# Patient Record
Sex: Female | Born: 1945 | Race: Black or African American | Hispanic: No | Marital: Married | Smoking: Never smoker
Health system: Southern US, Community
[De-identification: ages and names within clinical notes are randomized; demographics above are authoritative.]

## PROBLEM LIST (undated history)

## (undated) DIAGNOSIS — D649 Anemia, unspecified: Secondary | ICD-10-CM

## (undated) DIAGNOSIS — E7800 Pure hypercholesterolemia, unspecified: Secondary | ICD-10-CM

## (undated) DIAGNOSIS — Z860100 Personal history of colon polyps, unspecified: Secondary | ICD-10-CM

## (undated) DIAGNOSIS — G039 Meningitis, unspecified: Secondary | ICD-10-CM

## (undated) DIAGNOSIS — G459 Transient cerebral ischemic attack, unspecified: Secondary | ICD-10-CM

## (undated) DIAGNOSIS — I4891 Unspecified atrial fibrillation: Secondary | ICD-10-CM

## (undated) DIAGNOSIS — I6381 Other cerebral infarction due to occlusion or stenosis of small artery: Secondary | ICD-10-CM

## (undated) DIAGNOSIS — I499 Cardiac arrhythmia, unspecified: Secondary | ICD-10-CM

## (undated) DIAGNOSIS — I10 Essential (primary) hypertension: Secondary | ICD-10-CM

## (undated) DIAGNOSIS — R7303 Prediabetes: Secondary | ICD-10-CM

## (undated) DIAGNOSIS — I639 Cerebral infarction, unspecified: Secondary | ICD-10-CM

## (undated) DIAGNOSIS — K5790 Diverticulosis of intestine, part unspecified, without perforation or abscess without bleeding: Secondary | ICD-10-CM

## (undated) HISTORY — PX: ABDOMINAL HYSTERECTOMY: SHX81

## (undated) HISTORY — PX: COLONOSCOPY: SHX174

---

## 2004-11-22 ENCOUNTER — Ambulatory Visit: Payer: Self-pay | Admitting: Unknown Physician Specialty

## 2005-12-06 ENCOUNTER — Ambulatory Visit: Payer: Self-pay | Admitting: Unknown Physician Specialty

## 2007-03-26 ENCOUNTER — Inpatient Hospital Stay: Payer: Self-pay | Admitting: Internal Medicine

## 2007-03-26 ENCOUNTER — Other Ambulatory Visit: Payer: Self-pay

## 2007-04-03 ENCOUNTER — Encounter: Payer: Self-pay | Admitting: Internal Medicine

## 2007-04-07 ENCOUNTER — Encounter: Payer: Self-pay | Admitting: Internal Medicine

## 2007-05-05 ENCOUNTER — Encounter: Payer: Self-pay | Admitting: Internal Medicine

## 2007-08-18 ENCOUNTER — Emergency Department: Payer: Self-pay | Admitting: Emergency Medicine

## 2008-03-25 ENCOUNTER — Ambulatory Visit: Payer: Self-pay | Admitting: Unknown Physician Specialty

## 2008-09-26 ENCOUNTER — Emergency Department: Payer: Self-pay | Admitting: Emergency Medicine

## 2009-04-07 ENCOUNTER — Ambulatory Visit: Payer: Self-pay | Admitting: Unknown Physician Specialty

## 2010-04-21 ENCOUNTER — Ambulatory Visit: Payer: Self-pay | Admitting: Unknown Physician Specialty

## 2011-06-23 ENCOUNTER — Ambulatory Visit: Payer: Self-pay | Admitting: Unknown Physician Specialty

## 2012-06-24 ENCOUNTER — Ambulatory Visit: Payer: Self-pay | Admitting: Unknown Physician Specialty

## 2013-02-23 ENCOUNTER — Inpatient Hospital Stay: Payer: Self-pay | Admitting: Internal Medicine

## 2013-02-23 LAB — COMPREHENSIVE METABOLIC PANEL
Albumin: 3.6 g/dL (ref 3.4–5.0)
Alkaline Phosphatase: 87 U/L
Bilirubin,Total: 0.4 mg/dL (ref 0.2–1.0)
Calcium, Total: 8.9 mg/dL (ref 8.5–10.1)
Chloride: 106 mmol/L (ref 98–107)
Co2: 24 mmol/L (ref 21–32)
Creatinine: 0.93 mg/dL (ref 0.60–1.30)
EGFR (African American): >60
EGFR (Non-African Amer.): >60
Osmolality: 275 (ref 275–301)
Potassium: 3.4 mmol/L — ABNORMAL LOW (ref 3.5–5.1)
SGOT(AST): 36 U/L (ref 15–37)

## 2013-02-23 LAB — CBC WITH DIFFERENTIAL/PLATELET
Basophil #: 0 10*3/uL (ref 0.0–0.1)
Basophil %: 0.3 %
HCT: 35.6 % (ref 35.0–47.0)
HGB: 11.5 g/dL — ABNORMAL LOW (ref 12.0–16.0)
Lymphocyte #: 0.3 10*3/uL — ABNORMAL LOW (ref 1.0–3.6)
Lymphocyte %: 5.2 %
MCH: 27.8 pg (ref 26.0–34.0)
Monocyte #: 0.3 x10 3/mm (ref 0.2–0.9)
RBC: 4.13 10*6/uL (ref 3.80–5.20)
WBC: 5.4 10*3/uL (ref 3.6–11.0)

## 2013-02-23 LAB — URINALYSIS, COMPLETE
Bacteria: NONE SEEN
Bilirubin,UR: NEGATIVE
Blood: NEGATIVE
Ketone: NEGATIVE
Leukocyte Esterase: NEGATIVE
Ph: 6 (ref 4.5–8.0)
RBC,UR: 1 /HPF (ref 0–5)
WBC UR: 2 /HPF (ref 0–5)

## 2013-02-25 LAB — BASIC METABOLIC PANEL
Calcium, Total: 9.4 mg/dL (ref 8.5–10.1)
Chloride: 108 mmol/L — ABNORMAL HIGH (ref 98–107)
Co2: 22 mmol/L (ref 21–32)
Creatinine: 1.01 mg/dL (ref 0.60–1.30)
Glucose: 93 mg/dL (ref 65–99)
Osmolality: 278 (ref 275–301)
Potassium: 4.2 mmol/L (ref 3.5–5.1)
Sodium: 139 mmol/L (ref 136–145)

## 2013-02-25 LAB — CBC WITH DIFFERENTIAL/PLATELET
Basophil #: 0 10*3/uL (ref 0.0–0.1)
Basophil %: 0.3 %
Eosinophil %: 0 %
HCT: 31.4 % — ABNORMAL LOW (ref 35.0–47.0)
Lymphocyte %: 3.9 %
MCHC: 32.5 g/dL (ref 32.0–36.0)
MCV: 86 fL (ref 80–100)
Monocyte %: 2.1 %
RBC: 3.66 10*6/uL — ABNORMAL LOW (ref 3.80–5.20)
RDW: 15 % — ABNORMAL HIGH (ref 11.5–14.5)
WBC: 14.6 10*3/uL — ABNORMAL HIGH (ref 3.6–11.0)

## 2013-02-26 LAB — CBC WITH DIFFERENTIAL/PLATELET
Eosinophil #: 0 10*3/uL (ref 0.0–0.7)
HCT: 32.4 % — ABNORMAL LOW (ref 35.0–47.0)
Lymphocyte #: 0.5 10*3/uL — ABNORMAL LOW (ref 1.0–3.6)
MCH: 28 pg (ref 26.0–34.0)
MCHC: 32.5 g/dL (ref 32.0–36.0)
MCV: 86 fL (ref 80–100)
Monocyte #: 0.8 x10 3/mm (ref 0.2–0.9)
Neutrophil #: 18.4 10*3/uL — ABNORMAL HIGH (ref 1.4–6.5)
Platelet: 153 10*3/uL (ref 150–440)
RBC: 3.76 10*6/uL — ABNORMAL LOW (ref 3.80–5.20)
WBC: 19.7 10*3/uL — ABNORMAL HIGH (ref 3.6–11.0)

## 2013-02-26 LAB — BASIC METABOLIC PANEL
Calcium, Total: 9.2 mg/dL (ref 8.5–10.1)
Chloride: 109 mmol/L — ABNORMAL HIGH (ref 98–107)
Co2: 25 mmol/L (ref 21–32)
Creatinine: 0.95 mg/dL (ref 0.60–1.30)
EGFR (African American): >60
EGFR (Non-African Amer.): >60
Glucose: 104 mg/dL — ABNORMAL HIGH (ref 65–99)
Potassium: 3.7 mmol/L (ref 3.5–5.1)

## 2013-02-28 LAB — CULTURE, BLOOD (SINGLE)

## 2013-10-22 ENCOUNTER — Ambulatory Visit: Payer: Self-pay | Admitting: Physician Assistant

## 2013-12-25 ENCOUNTER — Ambulatory Visit: Payer: Self-pay | Admitting: Gastroenterology

## 2014-06-26 NOTE — H&P (Signed)
PATIENT NAME:  Dorothy Potter, Dorothy Potter MR#:  [id number] DATE OF BIRTH:  05-Jun-1945  DATE OF ADMISSION:  02/23/2013  PRIMARY CARE PROVIDER:  Lora PaulaMimi McLaughlin, PA-C   CHIEF COMPLAINT: Shortness of breath, cough, weakness.   HISTORY OF PRESENT ILLNESS: A 69 year old African-American female patient with a history of hypertension, presents to the Emergency Room complaining of weakness and cough. The patient has also had some shortness of breath earlier today, but no chest pain, orthopnea or edema. No dysuria. Here in the Emergency Room, the patient's chest x-ray has shown right upper lobe pneumonia along with saturations of 87% on room air, needing 2 liters oxygen, and is being admitted to the hospitalist service for further care.   The patient mentions that her blood pressure has been low, in the range of systolic of 100s, while normally it runs at 120s. The patient is on 4 different blood pressure medications, and has not taken those medications today.   PAST MEDICAL HISTORY: 1.  Hypertension.  2.  Chronic anemia.  3.  Hyperlipidemia.   PAST SURGICAL HISTORY: Partial hysterectomy.   SOCIAL HISTORY: The patient does not smoke. No alcohol. Lives at home with her husband.   CODE STATUS: FULL CODE.  ALLERGIES: No known drug allergies.   FAMILY HISTORY: Hypertension.   REVIEW OF SYSTEMS: CONSTITUTIONAL: Complains of weakness. No weight loss, weight gain.  EYES: No blurred vision, pain, tenderness.   ENT:  No tinnitus, ear pain, hearing loss.  RESPIRATORY: Has cough, some shortness of breath.  CARDIOVASCULAR: No chest pain, orthopnea, edema.  GASTROINTESTINAL: No nausea, vomiting, diarrhea, abdominal pain.  GENITOURINARY: No dysuria, hematuria, frequency.  ENDOCRINE: No polyuria, nocturia, thyroid problems. HEMATOLOGIC/LYMPHATIC:  No anemia, easy bruising, bleeding.  INTEGUMENTARY: No acne, rash, lesions. MUSCULOSKELETAL: No joint swelling, redness, effusion.  NEUROLOGIC: No focal numbness,  weakness, seizures. Does complain of generalized weakness.  PSYCHIATRIC:  No anxiety or depression.   HOME MEDICATIONS INCLUDE:   1.  Atenolol 50 mg oral daily.  2.  Aspirin 81 mg daily.  3.  Diovan 320 mg daily.  4.  Ferrous sulfate orally once a day.  5.  Lasix 20 mg daily.  6.  Lescol capsule 40 mg once a day.  7.  Norvasc 10 mg a day.  8.  Terazosin 5 mg daily.   PHYSICAL EXAMINATION: VITAL SIGNS: Temperature 99.7, with pulse of 86, blood pressure 124/56, saturating 87% on room air, 91% on 2 liters oxygen.  GENERAL: An obese, African-American female patient, lying in bed. Seems comfortable, in no acute distress.  PSYCHIATRIC:  She is alert, oriented x 3. Mood and affect appropriate. Judgment intact.  HEENT: Atraumatic, normocephalic. Oral mucosa dry and pink. External ears and nose normal. No pallor. No icterus. Pupils bilaterally equal and react to light.   NECK: Supple. No thyromegaly or palpable lymph nodes. Trachea midline. No carotid bruit, JVD.  CARDIOVASCULAR: S1, S2, without any murmurs. Peripheral pulses 2+.  RESPIRATORY: Normal work of breathing. Crackles in bilateral bases, left greater than right.  GASTROINTESTINAL: Soft abdomen, nontender. Bowel sounds present. No hepatosplenomegaly palpable.  GENITOURINARY: No CVA tendeness or bladder distention.  SKIN: Warm and dry. No petechiae, rash, ulcers.  MUSCULOSKELETAL: No joint swelling, redness, effusion of the large joints. Normal muscle tone.  NEUROLOGIC: Motor strength 5/5 in upper and lower extremities. Sensory is intact all over.  LYMPHATIC: No cervical lymphadenopathy.   LAB STUDIES: Show a glucose of 111, BUN 15, creatinine 0.93, sodium 137, potassium 3.4, chloride 106. GFR greater than  60. AST, ALT, alkaline phosphatase, bilirubin normal. hemoglobin 11.5, platelets 152, neutrophils 89%.   Chest x-ray, PA and lateral, shows right upper lobe pneumonia with mild left basilar opacity, likely atelectasis or pneumonia.    ASSESSMENT AND PLAN: 1. Bilateral community-acquired pneumonia with acute respiratory failure in a patient who is not  immunosuppressed or on steroids. Will admit the patient as inpatient, on IV Levaquin.  Oxygen to keep sats more than 90%, nebulizers p.r.n. The patient does have wheezing on examination. Will start her on oral steroids. Get sputum and blood cultures.   2.  Hypertension. The patient mentions that she had low blood pressure at home. Will hold off on her Norvasc and terazosin at this time. Continue the atenolol and Diovan.   3.  Hypokalemia. Replace orally.   4.  Hyperlipidemia. Continue her statin.  5.  Deep vein thrombosis prophylaxis with Lovenox.    6.  Code status:  FULL CODE.  Time spent on this case was 40 minutes.   ____________________________ Molinda Bailiff Dorene Bruni, MD srs:mr Potter: 02/23/2013 14:36:52 ET T: 02/23/2013 17:30:50 ET JOB#: [id number]  cc: Wardell Heath R. Liticia Gasior, MD, <Dictator> Maurine Minister, PA-C  Wardell Heath [place] MD ELECTRONICALLY SIGNED 02/24/2013 12:23

## 2014-06-27 NOTE — Discharge Summary (Signed)
PATIENT NAME:  Dorothy Potter, Havanna D MR#:  [id number] DATE OF BIRTH:  1945-11-23  DATE OF ADMISSION:  02/23/2013 DATE OF DISCHARGE:  02/26/2013  DISCHARGE DIAGNOSES: 1.  Acute respiratory failure secondary to pneumonia.  2.  Hypertension.  3.  Hyperlipidemia.  4.  Hypokalemia and chronic anemia.   CHIEF COMPLAINT: Shortness of breath, cough, weakness.   HISTORY OF PRESENT ILLNESS: Dorothy Potter is a 69 year old African American female, who presents to the ED complaining of cough associated with weakness and shortness of breath. She denies any chest pain or orthopnea. Chest x-ray showed evidence of right upper lobe pneumonia with O2 sat of 87% on room air, requiring supplemental oxygen. The patient also states that her blood pressure has been running low.   PAST MEDICAL HISTORY: Significant for hypertension, chronic anemia, hyperlipidemia.   PHYSICAL EXAMINATION: VITAL SIGNS: Temperature was 99.7. Pulse was 86. Blood pressure 124/56, O2 sat 87% on room air, improved to 91% on 2 liters nasal cannula oxygen.  GENERAL: She was not in distress.  HEENT: [place]/AT.  NECK: Supple.  HEART: S1, S2.  LUNGS: Crackles heard in both bases, especially in the right upper zone area.  ABDOMEN: Soft, nontender.  EXTREMITIES: No edema.   IMAGING AND LABORATORY DATA: Glucose 111, BUN 15, creatinine 0.93, sodium 137, potassium 3.4, chloride 106. Liver function tests were normal. Platelets 152. Chest x-ray showed evidence of upper lobe pneumonia with mild left basilar opacity, likely atelectasis.   The [facility] and started on intravenous Levaquin. She also underwent a CT of the chest, which showed evidence of dense right upper lobe consolidation with a pattern most typical for pneumonia. 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   During her stay in the hospital, the patient continued to make good progress. She was  weaned off her oxygen. She was also placed on prednisone taper, and her blood pressure medications were gradually reintroduced. She did have an elevated white cell count, part of that most likely was from the steroids; however, symptomatically, she was better, and she was discharged in stable condition on the following medications:  Levaquin 500 mg a day for 10 days, atenolol 50 mg b.i.d., ferrous sulfate 325 mg once a day, Norvasc 10 mg once a day, Accupril 81 mg a day, terazosin 5 mg once a day, Lescol 40 mg 1 capsule once a day and Diovan 320 mg once a day.  The patient  is advised to follow up with PA Maurine MinisterMiriam McLaughlin in 1 to 2 weeks' time. I advised also to have a chest x-ray at that time to ensure resolution of her pneumonia. The patient is stable at the time of discharge.   TOTAL TIME SPENT IN DISCHARGE OF THIS PATIENT: 35 minutes.   ____________________________ Barbette ReichmannVishwanath Cordie Beazley, MD vh:dmm D: 02/26/2013 12:23:58 ET T: 02/26/2013 12:47:49 ET JOB#: [id number]  cc: Barbette ReichmannVishwanath Melodie Ashworth, MD, <Dictator> Barbette ReichmannVISHWANATH Jacarie Pate MD ELECTRONICALLY SIGNED 03/07/2013 17:59

## 2014-06-29 LAB — SURGICAL PATHOLOGY

## 2014-10-07 ENCOUNTER — Other Ambulatory Visit: Payer: Self-pay | Admitting: Physician Assistant

## 2014-10-07 DIAGNOSIS — Z1231 Encounter for screening mammogram for malignant neoplasm of breast: Secondary | ICD-10-CM

## 2014-10-26 ENCOUNTER — Other Ambulatory Visit: Payer: Self-pay | Admitting: Physician Assistant

## 2014-10-26 ENCOUNTER — Ambulatory Visit
Admission: RE | Admit: 2014-10-26 | Discharge: 2014-10-26 | Disposition: A | Discharge status: Home / Self Care | Payer: Medicare Other | Source: Ambulatory Visit | Attending: Physician Assistant | Admitting: Physician Assistant

## 2014-10-26 DIAGNOSIS — Z1231 Encounter for screening mammogram for malignant neoplasm of breast: Secondary | ICD-10-CM | POA: Insufficient documentation

## 2015-07-23 ENCOUNTER — Other Ambulatory Visit: Payer: Self-pay | Admitting: Otolaryngology

## 2015-07-23 DIAGNOSIS — H903 Sensorineural hearing loss, bilateral: Secondary | ICD-10-CM

## 2015-09-09 ENCOUNTER — Ambulatory Visit
Admission: RE | Admit: 2015-09-09 | Discharge: 2015-09-09 | Disposition: A | Discharge status: Home / Self Care | Payer: Medicare Other | Source: Ambulatory Visit | Attending: Otolaryngology | Admitting: Otolaryngology

## 2015-09-09 DIAGNOSIS — H903 Sensorineural hearing loss, bilateral: Secondary | ICD-10-CM | POA: Diagnosis present

## 2015-09-09 DIAGNOSIS — R9089 Other abnormal findings on diagnostic imaging of central nervous system: Secondary | ICD-10-CM | POA: Diagnosis not present

## 2015-09-09 LAB — POCT I-STAT CREATININE: Creatinine, Ser: 0.8 mg/dL (ref 0.44–1.00)

## 2015-09-09 MED ORDER — GADOBENATE DIMEGLUMINE 529 MG/ML IV SOLN
20.0000 mL | Freq: Once | INTRAVENOUS | Status: AC | PRN
  Administered 2015-09-09: 16 mL via INTRAVENOUS

## 2015-10-14 ENCOUNTER — Other Ambulatory Visit: Payer: Self-pay | Admitting: Physician Assistant

## 2015-10-14 DIAGNOSIS — Z1231 Encounter for screening mammogram for malignant neoplasm of breast: Secondary | ICD-10-CM

## 2015-11-01 ENCOUNTER — Ambulatory Visit
Admission: RE | Admit: 2015-11-01 | Discharge: 2015-11-01 | Disposition: A | Discharge status: Home / Self Care | Payer: Medicare Other | Source: Ambulatory Visit | Attending: Physician Assistant | Admitting: Physician Assistant

## 2015-11-01 ENCOUNTER — Other Ambulatory Visit: Payer: Self-pay | Admitting: Physician Assistant

## 2015-11-01 DIAGNOSIS — Z1231 Encounter for screening mammogram for malignant neoplasm of breast: Secondary | ICD-10-CM

## 2016-10-17 ENCOUNTER — Other Ambulatory Visit: Payer: Self-pay | Admitting: Physician Assistant

## 2016-10-17 DIAGNOSIS — Z1231 Encounter for screening mammogram for malignant neoplasm of breast: Secondary | ICD-10-CM

## 2016-11-02 ENCOUNTER — Ambulatory Visit
Admission: RE | Admit: 2016-11-02 | Discharge: 2016-11-02 | Disposition: A | Discharge status: Home / Self Care | Payer: Medicare Other | Source: Ambulatory Visit | Attending: Physician Assistant | Admitting: Physician Assistant

## 2016-11-02 DIAGNOSIS — Z1231 Encounter for screening mammogram for malignant neoplasm of breast: Secondary | ICD-10-CM | POA: Diagnosis present

## 2017-10-09 ENCOUNTER — Other Ambulatory Visit: Payer: Self-pay | Admitting: Physician Assistant

## 2017-10-09 DIAGNOSIS — K625 Hemorrhage of anus and rectum: Secondary | ICD-10-CM

## 2017-10-09 DIAGNOSIS — R1032 Left lower quadrant pain: Secondary | ICD-10-CM

## 2017-10-15 ENCOUNTER — Ambulatory Visit
Admission: RE | Admit: 2017-10-15 | Discharge: 2017-10-15 | Disposition: A | Discharge status: Home / Self Care | Payer: Medicare Other | Source: Ambulatory Visit | Attending: Physician Assistant | Admitting: Physician Assistant

## 2017-10-15 DIAGNOSIS — K625 Hemorrhage of anus and rectum: Secondary | ICD-10-CM | POA: Diagnosis present

## 2017-10-15 DIAGNOSIS — R1032 Left lower quadrant pain: Secondary | ICD-10-CM | POA: Insufficient documentation

## 2017-10-15 HISTORY — DX: Essential (primary) hypertension: I10

## 2017-10-15 MED ORDER — IOHEXOL 300 MG/ML  SOLN
100.0000 mL | Freq: Once | INTRAMUSCULAR | Status: AC | PRN
  Administered 2017-10-15: 100 mL via INTRAVENOUS

## 2017-11-07 ENCOUNTER — Other Ambulatory Visit: Payer: Self-pay | Admitting: Physician Assistant

## 2017-11-07 DIAGNOSIS — Z1231 Encounter for screening mammogram for malignant neoplasm of breast: Secondary | ICD-10-CM

## 2017-11-23 ENCOUNTER — Ambulatory Visit
Admission: RE | Admit: 2017-11-23 | Discharge: 2017-11-23 | Disposition: A | Discharge status: Home / Self Care | Payer: Medicare Other | Source: Ambulatory Visit | Attending: Physician Assistant | Admitting: Physician Assistant

## 2017-11-23 DIAGNOSIS — Z1231 Encounter for screening mammogram for malignant neoplasm of breast: Secondary | ICD-10-CM | POA: Diagnosis present

## 2017-12-17 ENCOUNTER — Encounter: Payer: Self-pay | Admitting: *Deleted

## 2017-12-18 ENCOUNTER — Encounter
Admission: RE | Disposition: A | Discharge status: Home / Self Care | Payer: Self-pay | Source: Ambulatory Visit | Attending: Internal Medicine

## 2017-12-18 ENCOUNTER — Other Ambulatory Visit: Payer: Self-pay

## 2017-12-18 ENCOUNTER — Ambulatory Visit: Payer: Medicare Other | Admitting: Anesthesiology

## 2017-12-18 ENCOUNTER — Ambulatory Visit
Admission: RE | Admit: 2017-12-18 | Discharge: 2017-12-18 | Disposition: A | Discharge status: Home / Self Care | Payer: Medicare Other | Source: Ambulatory Visit | Attending: Internal Medicine | Admitting: Internal Medicine

## 2017-12-18 ENCOUNTER — Encounter: Payer: Self-pay | Admitting: *Deleted

## 2017-12-18 DIAGNOSIS — I10 Essential (primary) hypertension: Secondary | ICD-10-CM | POA: Insufficient documentation

## 2017-12-18 DIAGNOSIS — Z8673 Personal history of transient ischemic attack (TIA), and cerebral infarction without residual deficits: Secondary | ICD-10-CM | POA: Diagnosis not present

## 2017-12-18 DIAGNOSIS — K640 First degree hemorrhoids: Secondary | ICD-10-CM | POA: Insufficient documentation

## 2017-12-18 DIAGNOSIS — Z951 Presence of aortocoronary bypass graft: Secondary | ICD-10-CM | POA: Diagnosis not present

## 2017-12-18 DIAGNOSIS — Z881 Allergy status to other antibiotic agents status: Secondary | ICD-10-CM | POA: Diagnosis not present

## 2017-12-18 DIAGNOSIS — Z79899 Other long term (current) drug therapy: Secondary | ICD-10-CM | POA: Insufficient documentation

## 2017-12-18 DIAGNOSIS — Z86010 Personal history of colonic polyps: Secondary | ICD-10-CM | POA: Diagnosis not present

## 2017-12-18 DIAGNOSIS — R933 Abnormal findings on diagnostic imaging of other parts of digestive tract: Secondary | ICD-10-CM | POA: Diagnosis present

## 2017-12-18 DIAGNOSIS — K635 Polyp of colon: Secondary | ICD-10-CM | POA: Insufficient documentation

## 2017-12-18 DIAGNOSIS — Z7982 Long term (current) use of aspirin: Secondary | ICD-10-CM | POA: Diagnosis not present

## 2017-12-18 DIAGNOSIS — E7800 Pure hypercholesterolemia, unspecified: Secondary | ICD-10-CM | POA: Diagnosis not present

## 2017-12-18 DIAGNOSIS — K5730 Diverticulosis of large intestine without perforation or abscess without bleeding: Secondary | ICD-10-CM | POA: Insufficient documentation

## 2017-12-18 HISTORY — DX: Cerebral infarction, unspecified: I63.9

## 2017-12-18 HISTORY — DX: Personal history of colon polyps, unspecified: Z86.0100

## 2017-12-18 HISTORY — DX: Personal history of colonic polyps: Z86.010

## 2017-12-18 HISTORY — DX: Anemia, unspecified: D64.9

## 2017-12-18 HISTORY — DX: Diverticulosis of intestine, part unspecified, without perforation or abscess without bleeding: K57.90

## 2017-12-18 HISTORY — PX: COLONOSCOPY WITH PROPOFOL: SHX5780

## 2017-12-18 HISTORY — DX: Pure hypercholesterolemia, unspecified: E78.00

## 2017-12-18 SURGERY — COLONOSCOPY WITH PROPOFOL
Anesthesia: General

## 2017-12-18 MED ORDER — PROPOFOL 500 MG/50ML IV EMUL
INTRAVENOUS | Status: AC
  Filled 2017-12-18: qty 50

## 2017-12-18 MED ORDER — PROPOFOL 500 MG/50ML IV EMUL
INTRAVENOUS | Status: DC | PRN
  Administered 2017-12-18: 150 ug/kg/min via INTRAVENOUS

## 2017-12-18 MED ORDER — LIDOCAINE HCL (PF) 2 % IJ SOLN
INTRAMUSCULAR | Status: AC
  Filled 2017-12-18: qty 10

## 2017-12-18 MED ORDER — SODIUM CHLORIDE 0.9 % IV SOLN
INTRAVENOUS | Status: DC
  Administered 2017-12-18: 12:00:00 via INTRAVENOUS

## 2017-12-18 MED ORDER — GLYCOPYRROLATE 0.2 MG/ML IJ SOLN
INTRAMUSCULAR | Status: DC | PRN
  Administered 2017-12-18: .2 mg via INTRAVENOUS

## 2017-12-18 MED ORDER — GLYCOPYRROLATE 0.2 MG/ML IJ SOLN
INTRAMUSCULAR | Status: AC
  Filled 2017-12-18: qty 1

## 2017-12-18 MED ORDER — LIDOCAINE HCL (CARDIAC) PF 100 MG/5ML IV SOSY
PREFILLED_SYRINGE | INTRAVENOUS | Status: DC | PRN
  Administered 2017-12-18: 100 mg via INTRAVENOUS

## 2017-12-18 MED ORDER — PROPOFOL 10 MG/ML IV BOLUS
INTRAVENOUS | Status: DC | PRN
  Administered 2017-12-18: 80 mg via INTRAVENOUS

## 2017-12-18 NOTE — Anesthesia Preprocedure Evaluation (Signed)
Anesthesia Evaluation  Patient identified by MRN, date of birth, ID band Patient awake    Reviewed: Allergy & Precautions, H&P , NPO status , Patient's Chart, lab work & pertinent test results, reviewed documented beta blocker date and time   History of Anesthesia Complications Negative for: history of anesthetic complications  Airway Mallampati: II  TM Distance: >3 FB Neck ROM: full    Dental  (+) Dental Advidsory Given, Partial Upper, Missing, Poor Dentition   Pulmonary neg pulmonary ROS,           Cardiovascular Exercise Tolerance: Good hypertension, (-) angina(-) CAD, (-) Past MI, (-) Cardiac Stents and (-) CABG (-) dysrhythmias (-) Valvular Problems/Murmurs     Neuro/Psych negative neurological ROS  negative psych ROS   GI/Hepatic negative GI ROS, Neg liver ROS,   Endo/Other  negative endocrine ROS  Renal/GU negative Renal ROS  negative genitourinary   Musculoskeletal   Abdominal   Peds  Hematology negative hematology ROS (+)   Anesthesia Other Findings Past Medical History: No date: Anemia No date: Diverticulosis No date: History of colon polyps No date: Hypertension No date: Pure hypercholesterolemia No date: Stroke (HCC)   Reproductive/Obstetrics negative OB ROS                             Anesthesia Physical Anesthesia Plan  ASA: II  Anesthesia Plan: General   Post-op Pain Management:    Induction: Intravenous  PONV Risk Score and Plan: 3 and Propofol infusion and TIVA  Airway Management Planned: Natural Airway and Nasal Cannula  Additional Equipment:   Intra-op Plan:   Post-operative Plan:   Informed Consent: I have reviewed the patients History and Physical, chart, labs and discussed the procedure including the risks, benefits and alternatives for the proposed anesthesia with the patient or authorized representative who has indicated his/her understanding and  acceptance.   Dental Advisory Given  Plan Discussed with: Anesthesiologist, CRNA and Surgeon  Anesthesia Plan Comments:         Anesthesia Quick Evaluation

## 2017-12-18 NOTE — Transfer of Care (Signed)
Immediate Anesthesia Transfer of Care Note  Patient: Dorothy Potter  Procedure(s) Performed: COLONOSCOPY WITH PROPOFOL (N/A )  Patient Location: Endoscopy Unit  Anesthesia Type:General  Level of Consciousness: awake and alert   Airway & Oxygen Therapy: Patient Spontanous Breathing and Patient connected to nasal cannula oxygen  Post-op Assessment: Report given to RN and Post -op Vital signs reviewed and stable  Post vital signs: Reviewed and stable  Last Vitals:  Vitals Value Taken Time  BP 118/58 12/18/2017  1:15 PM  Temp    Pulse 63 12/18/2017  1:17 PM  Resp 10 12/18/2017  1:17 PM  SpO2 100 % 12/18/2017  1:17 PM  Vitals shown include unvalidated device data.  Last Pain:  Vitals:   12/18/17 1316  TempSrc: (P) Tympanic  PainSc:          Complications: No apparent anesthesia complications

## 2017-12-18 NOTE — Anesthesia Post-op Follow-up Note (Signed)
Anesthesia QCDR form completed.        

## 2017-12-18 NOTE — Interval H&P Note (Signed)
History and Physical Interval Note:  12/18/2017 12:32 PM  Dorothy Potter  has presented today for surgery, with the diagnosis of TUBULAR ADEOMA  The various methods of treatment have been discussed with the patient and family. After consideration of risks, benefits and other options for treatment, the patient has consented to  Procedure(s): COLONOSCOPY WITH PROPOFOL (N/A) ESOPHAGOGASTRODUODENOSCOPY (EGD) WITH PROPOFOL (N/A) as a surgical intervention .  The patient's history has been reviewed, patient examined, no change in status, stable for surgery.  I have reviewed the patient's chart and labs.  Questions were answered to the patient's satisfaction.     [name], [name]

## 2017-12-18 NOTE — Op Note (Signed)
[facility] Gastroenterology Patient Name: Dorothy Potter Procedure Date: 12/18/2017 12:19 PM MRN: [id number] Account #: [id number] Date of Birth: Dec 21, 1945 Admit Type: Outpatient Age: 72 Room: [facility] ENDO ROOM 3 Gender: Female Note Status: Finalized Procedure:            Colonoscopy Indications:          High risk colon cancer surveillance: Personal history                        of colonic polyps, Incidental - Abnormal CT of the GI                        tract, Incidental - Follow-up of diverticulitis Providers:            Boykin Nearing. Norma Fredrickson MD, MD Referring MD:         Marilynne Halsted, MD (Referring MD) Medicines:            Propofol per Anesthesia Complications:        No immediate complications. Procedure:            Pre-Anesthesia Assessment:                       - The risks and benefits of the procedure and the                        sedation options and risks were discussed with the                        patient. All questions were answered and informed                        consent was obtained.                       - Patient identification and proposed procedure were                        verified prior to the procedure by the nurse. The                        procedure was verified in the procedure room.                       - ASA Grade Assessment: II - A patient with mild                        systemic disease.                       - After reviewing the risks and benefits, the patient                        was deemed in satisfactory condition to undergo the                        procedure.                       After obtaining informed consent, the colonoscope was  passed under direct vision. Throughout the procedure,                        the patient's blood pressure, pulse, and oxygen                        saturations were monitored continuously. The                        Colonoscope was introduced through the  anus and                        advanced to the the cecum, identified by appendiceal                        orifice and ileocecal valve. The colonoscopy was                        somewhat difficult due to restricted mobility of the                        colon. Successful completion of the procedure was aided                        by applying abdominal pressure. The patient tolerated                        the procedure well. The quality of the bowel                        preparation was adequate. Findings:      The perianal and digital rectal examinations were normal. Pertinent       negatives include normal sphincter tone and no palpable rectal lesions.      A few small-mouthed diverticula were found in the left colon and right       colon.      A 5 mm polyp was found in the transverse colon. The polyp was sessile.       The polyp was removed with a jumbo cold forceps. The polyp was removed       with a hot snare. Resection and retrieval were complete.      Non-bleeding internal hemorrhoids were found during retroflexion. The       hemorrhoids were Grade I (internal hemorrhoids that do not prolapse).      The exam was otherwise without abnormality. Impression:           - Diverticulosis in the left colon and in the right                        colon.                       - One 5 mm polyp in the transverse colon, removed with                        a jumbo cold forceps and removed with a hot snare.                        Resected and retrieved.                       -  Non-bleeding internal hemorrhoids.                       - The examination was otherwise normal. Recommendation:       - Patient has a contact number available for                        emergencies. The signs and symptoms of potential                        delayed complications were discussed with the patient.                        Return to normal activities tomorrow. Written discharge                         instructions were provided to the patient.                       - Resume previous diet.                       - Continue present medications.                       - Await pathology results.                       - Repeat colonoscopy is recommended for surveillance.                        The colonoscopy date will be determined after pathology                        results from today's exam become available for review.                       - Return to GI office in 1 year.                       - The findings and recommendations were discussed with                        the patient and their family. Procedure Code(s):    --- Professional ---                       [phone], Colonoscopy, flexible; with removal of tumor(s),                        polyp(s), or other lesion(s) by snare technique Diagnosis Code(s):    --- Professional ---                       Z86.010, Personal history of colonic polyps                       K64.0, First degree hemorrhoids                       D12.3, Benign neoplasm of transverse colon (hepatic  flexure or splenic flexure)                       K57.30, Diverticulosis of large intestine without                        perforation or abscess without bleeding CPT copyright 2018 American [employer] Association. All rights reserved. The codes documented in this report are preliminary and upon coder review may  be revised to meet current compliance requirements. Stanton Kidney MD, MD 12/18/2017 1:16:13 PM This report has been signed electronically. Number of Addenda: 0 Note Initiated On: 12/18/2017 12:19 PM Scope Withdrawal Time: 0 hours 9 minutes 2 seconds  Total Procedure Duration: 0 hours 18 minutes 4 seconds       [facility]

## 2017-12-18 NOTE — H&P (Signed)
  Outpatient short stay form Pre-procedure 12/18/2017 12:31 PM Duglas Heier K. Norma Fredrickson, M.D.  Primary Physician: Maurine Minister  Reason for visit: Personal history: Polyps, history of diverticulitis, abnormal CT scan of the intestinal tract  History of present illness: Pleasant 72 year old female several weeks resolved from an attack of diverticulitis.  She had an abnormal CT scan showing thickening of the colon.  She currently has no abdominal pain, rectal bleeding, fever chills or sweats.  She presents for surveillance of colon polyps.    Current Facility-Administered Medications:  .  0.9 %  sodium chloride infusion, , Intravenous, Continuous, [place], Boykin Nearing, MD, Last Rate: 20 mL/hr at 12/18/17 1227  Medications Prior to Admission  Medication Sig Dispense Refill Last Dose  . amLODipine (NORVASC) 5 MG tablet Take 5 mg by mouth daily.   12/17/2017 at Unknown time  . aspirin EC 81 MG tablet Take 81 mg by mouth daily.     Marland Kitchen atenolol (TENORMIN) 50 MG tablet Take 50 mg by mouth daily.   12/18/2017 at 0600  . calcium carbonate (OSCAL) 1500 (600 Ca) MG TABS tablet Take 600 mg by mouth 2 (two) times daily with a meal.   12/16/2017  . cholecalciferol (VITAMIN D) 400 units TABS tablet Take 800 Units by mouth.   12/11/2017  . ferrous sulfate 325 (65 FE) MG tablet Take 325 mg by mouth daily with breakfast.     . furosemide (LASIX) 20 MG tablet Take 20 mg by mouth.     . losartan (COZAAR) 100 MG tablet Take 100 mg by mouth daily.   12/18/2017 at 0600  . rosuvastatin (CRESTOR) 10 MG tablet Take 10 mg by mouth daily.   12/17/2017 at Unknown time  . terazosin (HYTRIN) 5 MG capsule Take 5 mg by mouth at bedtime.   12/16/2017 at Unknown time     Allergies  Allergen Reactions  . Ciprocin-Fluocin-Procin [Fluocinolone] Other (See Comments)    Dizziness   . Flagyl [Metronidazole] Other (See Comments)    Dizziness   . Quinolones Other (See Comments)    Dizziness     Past Medical History:   Diagnosis Date  . Anemia   . Diverticulosis   . History of colon polyps   . Hypertension   . Pure hypercholesterolemia   . Stroke [facility])     Review of systems:  Otherwise negative.    Physical Exam  Gen: Alert, oriented. Appears stated age.  HEENT: [place]/AT. PERRLA. Lungs: CTA, no wheezes. CV: RR nl S1, S2. Abd: soft, benign, no masses. BS+ Ext: No edema. Pulses 2+    Planned procedures: Proceed with colonoscopy. The patient understands the nature of the planned procedure, indications, risks, alternatives and potential complications including but not limited to bleeding, infection, perforation, damage to internal organs and possible oversedation/side effects from anesthesia. The patient agrees and gives consent to proceed.  Please refer to procedure notes for findings, recommendations and patient disposition/instructions.     Zailey Audia K. Norma Fredrickson, M.D. Gastroenterology 12/18/2017  12:31 PM

## 2017-12-18 NOTE — Anesthesia Postprocedure Evaluation (Signed)
Anesthesia Post Note  Patient: Dorothy Potter  Procedure(s) Performed: COLONOSCOPY WITH PROPOFOL (N/A )  Patient location during evaluation: Endoscopy Anesthesia Type: General Level of consciousness: awake and alert Pain management: pain level controlled Vital Signs Assessment: post-procedure vital signs reviewed and stable Respiratory status: spontaneous breathing, nonlabored ventilation, respiratory function stable and patient connected to nasal cannula oxygen Cardiovascular status: blood pressure returned to baseline and stable Postop Assessment: no apparent nausea or vomiting Anesthetic complications: no     Last Vitals:  Vitals:   12/18/17 1326 12/18/17 1336  BP: (!) 144/70 (!) 154/72  Pulse: 68 69  Resp: 15 16  Temp:    SpO2: 100% 100%    Last Pain:  Vitals:   12/18/17 1336  TempSrc:   PainSc: 0-No pain                 Lenard Simmer

## 2017-12-19 LAB — SURGICAL PATHOLOGY

## 2017-12-20 ENCOUNTER — Encounter: Payer: Self-pay | Admitting: Internal Medicine

## 2018-09-29 IMAGING — CT CT ABD-PELV W/ CM
2 of 5 series · 16 of 46 positions shown, 18 images · IV contrast (omnipaque)
Comparison: None.

ADDENDUM:
The conclusion should say eccentric chronic bowel wall thickening of
the sigmoid colon. Further evaluation with direct visualization is
recommended to assess for colonic neoplasm.
CLINICAL DATA: Rectal bleeding.

EXAM:
CT ABDOMEN AND PELVIS WITH CONTRAST
TECHNIQUE: Multidetector CT imaging of the abdomen and pelvis was performed
using the standard protocol following bolus administration of
intravenous contrast.
CONTRAST:  100mL OMNIPAQUE IOHEXOL 300 MG/ML  SOLN

[Series 2: abd pelvis · axial · 0.71mm/px · z∈[-1525,-1140]mm · 13 of 87 slices shown, 15 images (1 of 2)]
[im 5/87  soft-tissue]
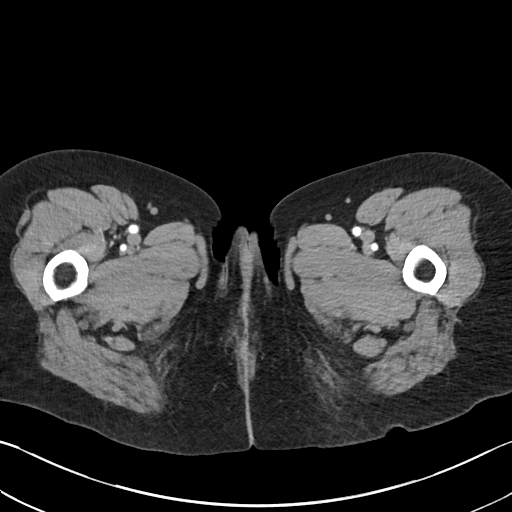
[im 5/87  bone]
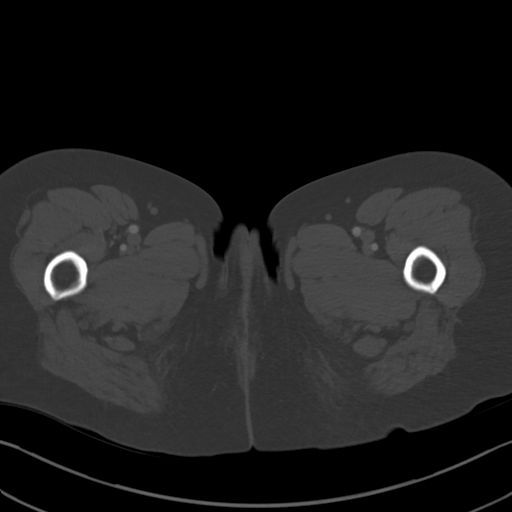
[im 10/87  soft-tissue]
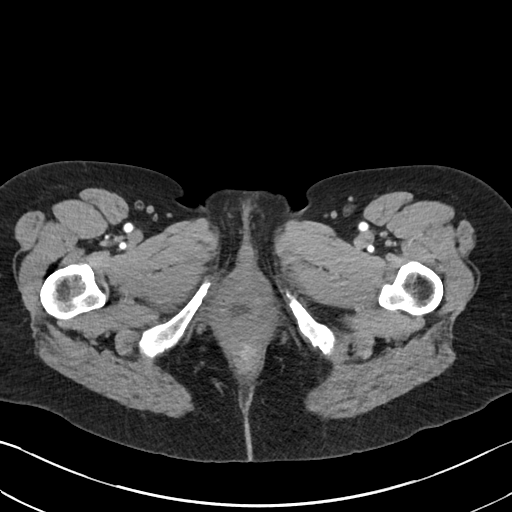
[im 20/87  soft-tissue]
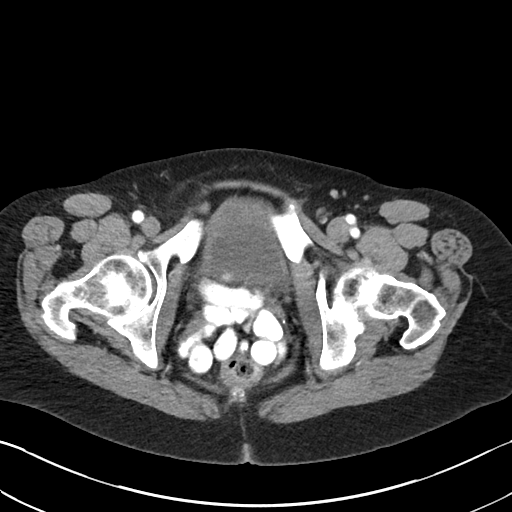
[im 24/87  soft-tissue]
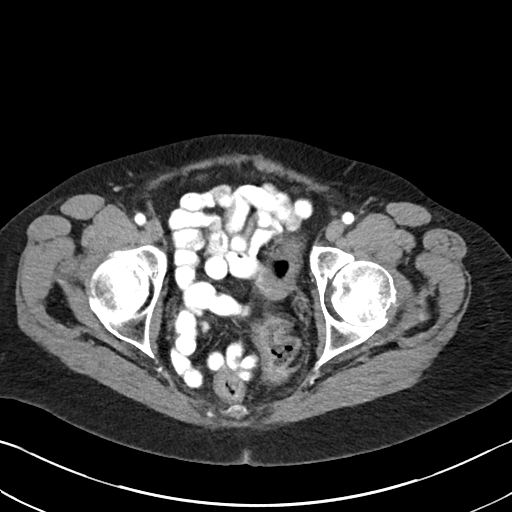
[im 29/87  soft-tissue]
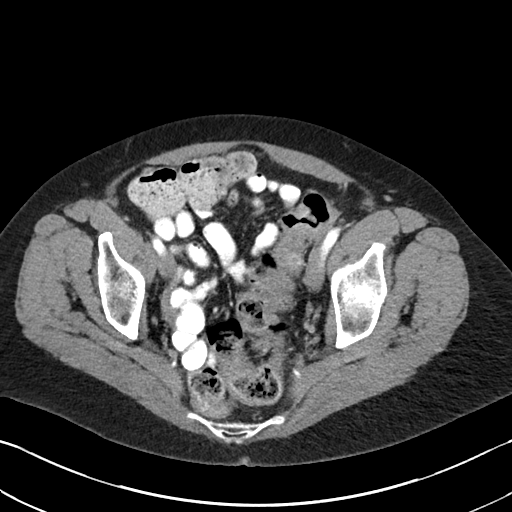
[im 39/87  soft-tissue]
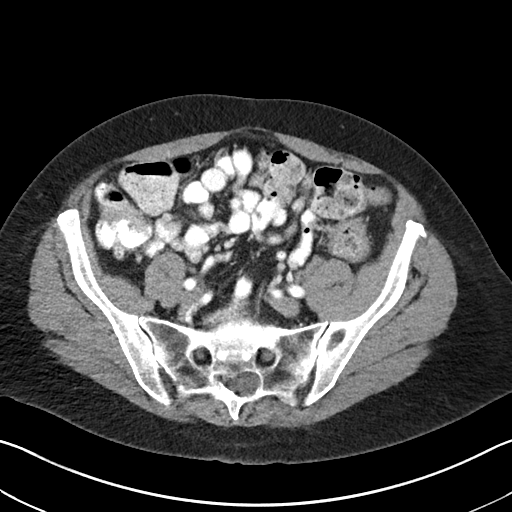
[im 44/87  soft-tissue]
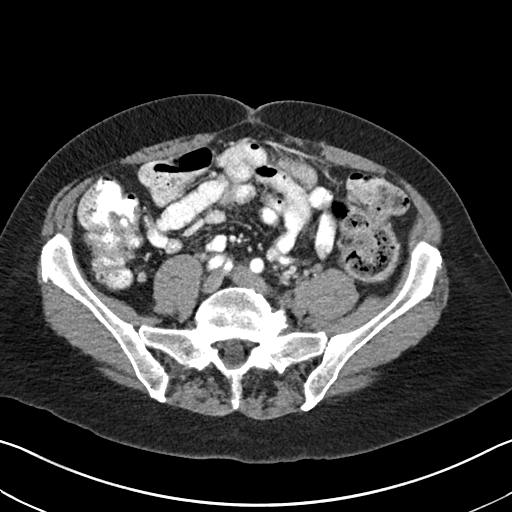
[im 48/87  soft-tissue]
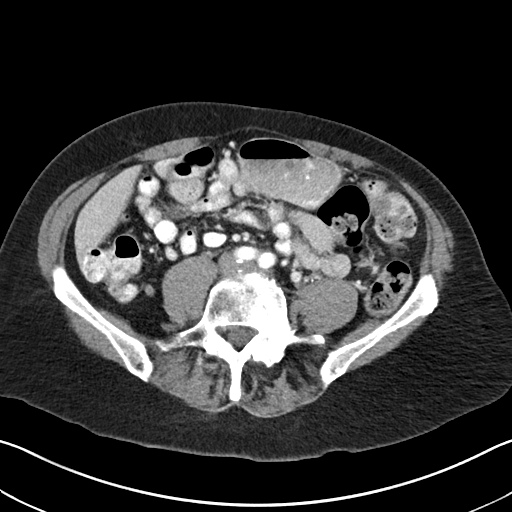
[im 58/87  soft-tissue]
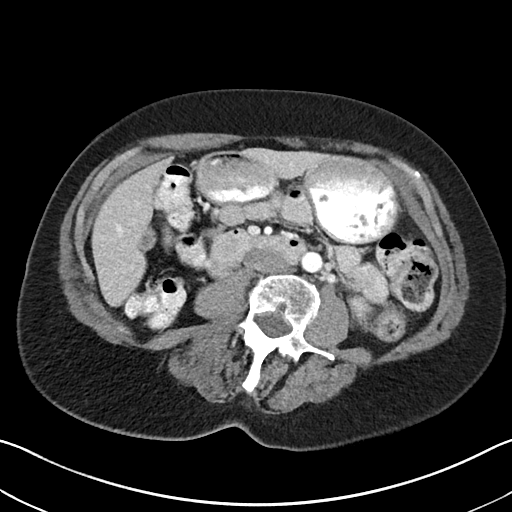
[im 58/87  bone]
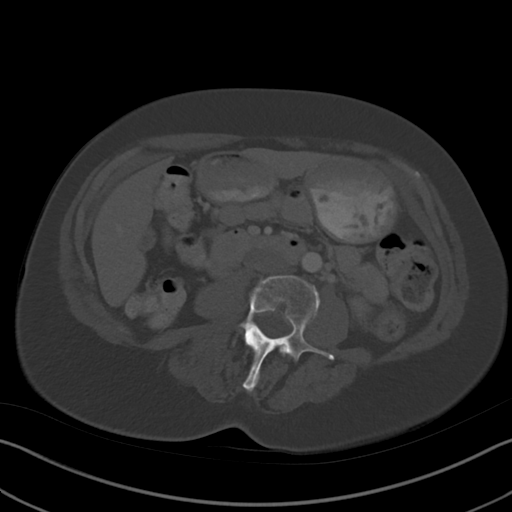
[im 63/87  soft-tissue]
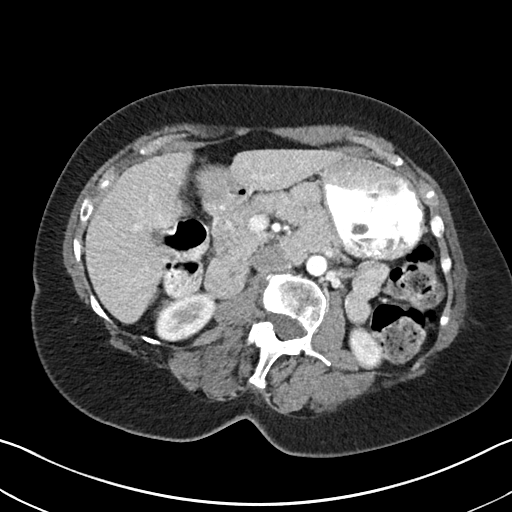
[im 67/87  soft-tissue]
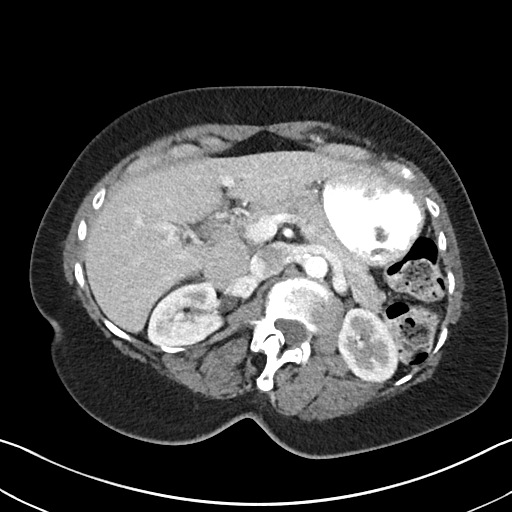
[im 77/87  soft-tissue]
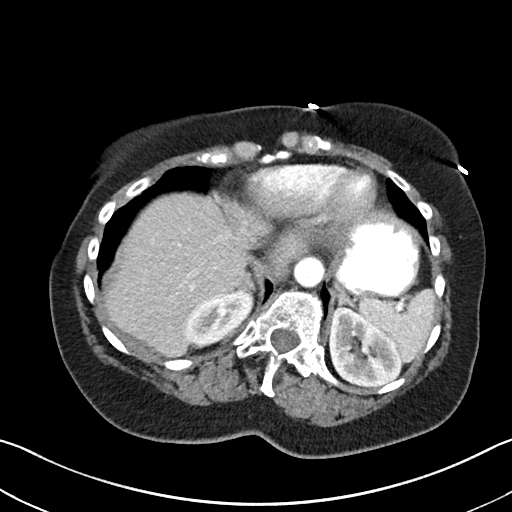
[im 82/87  soft-tissue]
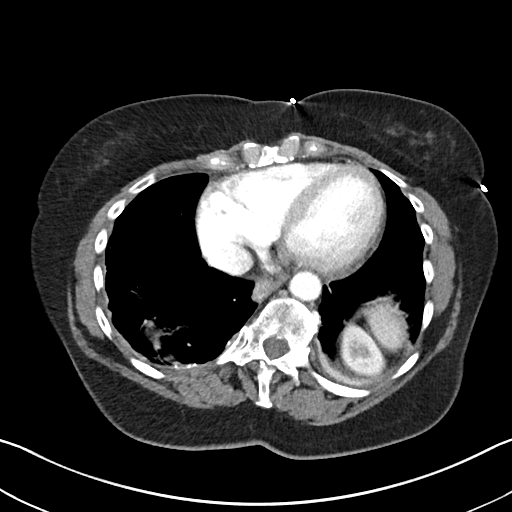

[Series 4: abd pelvis · coronal · 0.71mm/px · 3 of 137 slices shown (2 of 2)]
[im 46/137  soft-tissue]
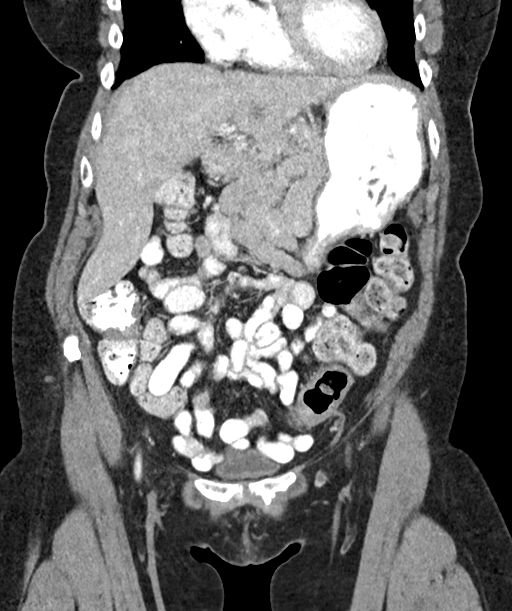
[im 61/137  soft-tissue]
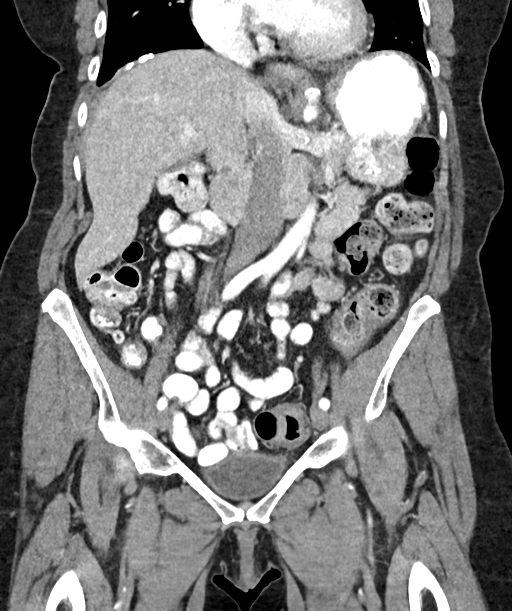
[im 76/137  soft-tissue]
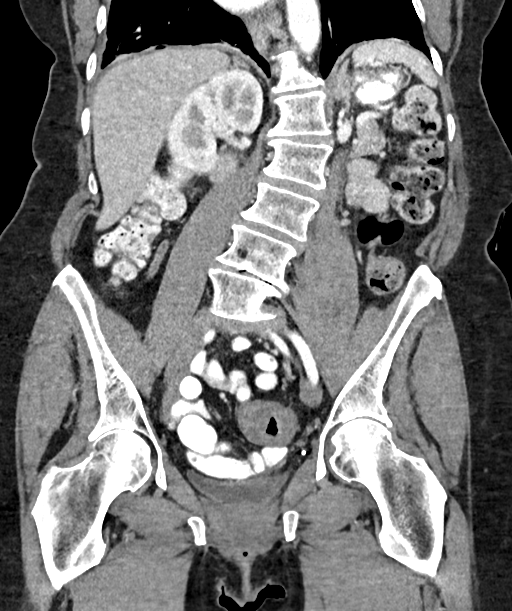

[16 of 46 positions shown; findings below may reference images not displayed]

FINDINGS: Lower chest: There is mild atelectasis of bilateral lung bases. The
heart size is mildly enlarged.

Hepatobiliary: No focal liver abnormality is seen. No gallstones,
gallbladder wall thickening, or biliary dilatation.

Pancreas: Unremarkable. No pancreatic ductal dilatation or
surrounding inflammatory changes.

Spleen: Normal in size without focal abnormality.

Adrenals/Urinary Tract: Adrenal glands are unremarkable. There is no
hydronephrosis bilaterally. There is a simple cyst in the midpole
right kidney. The left kidney is normal. The bladder is normal.

Stomach/Bowel: Stomach is within normal limits. Appendix appears
normal. There is no small bowel obstruction. There is eccentric
colonic bowel wall thickening of the sigmoid colon. There is no
colonic obstruction.

Vascular/Lymphatic: Aortic atherosclerosis. No enlarged abdominal or
pelvic lymph nodes.

Reproductive: Status post hysterectomy. No adnexal masses.

Other: None.

Musculoskeletal: Degenerative joint changes of the spine are noted.
IMPRESSION: Is strand [employer] colonic bowel wall thickening of the sigmoid colon.
Further evaluation with direct visualization is recommended to
assess for colonic neoplasm.

No acute abnormality identified.

## 2020-09-08 ENCOUNTER — Other Ambulatory Visit: Payer: Self-pay | Admitting: Physician Assistant

## 2020-09-08 DIAGNOSIS — Z1231 Encounter for screening mammogram for malignant neoplasm of breast: Secondary | ICD-10-CM

## 2020-12-19 ENCOUNTER — Emergency Department: Payer: Medicare Other

## 2020-12-19 ENCOUNTER — Emergency Department
Admission: EM | Admit: 2020-12-19 | Discharge: 2020-12-19 | Disposition: A | Discharge status: Home / Self Care | Payer: Medicare Other | Attending: Emergency Medicine | Admitting: Emergency Medicine

## 2020-12-19 DIAGNOSIS — R634 Abnormal weight loss: Secondary | ICD-10-CM | POA: Insufficient documentation

## 2020-12-19 DIAGNOSIS — Z79899 Other long term (current) drug therapy: Secondary | ICD-10-CM | POA: Diagnosis not present

## 2020-12-19 DIAGNOSIS — I4891 Unspecified atrial fibrillation: Secondary | ICD-10-CM | POA: Insufficient documentation

## 2020-12-19 DIAGNOSIS — R002 Palpitations: Secondary | ICD-10-CM | POA: Diagnosis present

## 2020-12-19 DIAGNOSIS — Z7901 Long term (current) use of anticoagulants: Secondary | ICD-10-CM | POA: Insufficient documentation

## 2020-12-19 DIAGNOSIS — R0602 Shortness of breath: Secondary | ICD-10-CM | POA: Insufficient documentation

## 2020-12-19 DIAGNOSIS — I10 Essential (primary) hypertension: Secondary | ICD-10-CM | POA: Insufficient documentation

## 2020-12-19 LAB — CBC WITH DIFFERENTIAL/PLATELET
Abs Immature Granulocytes: 0.02 10*3/uL (ref 0.00–0.07)
Basophils Absolute: 0 10*3/uL (ref 0.0–0.1)
Basophils Relative: 1 %
Eosinophils Absolute: 0.1 10*3/uL (ref 0.0–0.5)
Eosinophils Relative: 2 %
HCT: 36.9 % (ref 36.0–46.0)
Hemoglobin: 12.6 g/dL (ref 12.0–15.0)
Immature Granulocytes: 0 %
Lymphocytes Relative: 17 %
Lymphs Abs: 1.2 10*3/uL (ref 0.7–4.0)
MCH: 30.7 pg (ref 26.0–34.0)
MCHC: 34.1 g/dL (ref 30.0–36.0)
MCV: 89.8 fL (ref 80.0–100.0)
Monocytes Absolute: 0.6 10*3/uL (ref 0.1–1.0)
Monocytes Relative: 8 %
Neutro Abs: 5.2 10*3/uL (ref 1.7–7.7)
Neutrophils Relative %: 72 %
Platelets: 155 10*3/uL (ref 150–400)
RBC: 4.11 MIL/uL (ref 3.87–5.11)
RDW: 14.1 % (ref 11.5–15.5)
WBC: 7.1 10*3/uL (ref 4.0–10.5)
nRBC: 0 % (ref 0.0–0.2)

## 2020-12-19 LAB — COMPREHENSIVE METABOLIC PANEL
ALT: 39 U/L (ref 0–44)
AST: 41 U/L (ref 15–41)
Albumin: 4.2 g/dL (ref 3.5–5.0)
Alkaline Phosphatase: 82 U/L (ref 38–126)
Anion gap: 9 (ref 5–15)
BUN: 22 mg/dL (ref 8–23)
CO2: 26 mmol/L (ref 22–32)
Calcium: 9.7 mg/dL (ref 8.9–10.3)
Chloride: 105 mmol/L (ref 98–111)
Creatinine, Ser: 0.81 mg/dL (ref 0.44–1.00)
GFR, Estimated: >60 mL/min (ref >60)
Glucose, Bld: 113 mg/dL — ABNORMAL HIGH (ref 70–99)
Potassium: 3.6 mmol/L (ref 3.5–5.1)
Sodium: 140 mmol/L (ref 135–145)
Total Bilirubin: 0.7 mg/dL (ref 0.3–1.2)
Total Protein: 7.3 g/dL (ref 6.5–8.1)

## 2020-12-19 LAB — TROPONIN I (HIGH SENSITIVITY): Troponin I (High Sensitivity): 5 ng/L (ref <18)

## 2020-12-19 LAB — TSH: TSH: 1.575 u[IU]/mL (ref 0.350–4.500)

## 2020-12-19 LAB — MAGNESIUM: Magnesium: 2 mg/dL (ref 1.7–2.4)

## 2020-12-19 LAB — BRAIN NATRIURETIC PEPTIDE: B Natriuretic Peptide: 171.9 pg/mL — ABNORMAL HIGH (ref 0.0–100.0)

## 2020-12-19 MED ORDER — APIXABAN 5 MG PO TABS: 5.0000 mg | ORAL_TABLET | Freq: Two times a day (BID) | ORAL | 0 refills | Status: AC

## 2020-12-19 MED ORDER — METOPROLOL TARTRATE 25 MG PO TABS
25.0000 mg | ORAL_TABLET | Freq: Once | ORAL | Status: AC
  Administered 2020-12-19: 25 mg via ORAL
  Filled 2020-12-19: qty 1

## 2020-12-19 MED ORDER — METOPROLOL TARTRATE 5 MG/5ML IV SOLN: 2.5000 mg | Freq: Once | INTRAVENOUS | Status: DC

## 2020-12-19 MED ORDER — DILTIAZEM HCL ER COATED BEADS 180 MG PO CP24: 180.0000 mg | ORAL_CAPSULE | Freq: Every day | ORAL | 0 refills | Status: AC

## 2020-12-19 NOTE — Discharge Instructions (Addendum)
For your medications:  1.) STOP your AMLODIPINE. This will be replaced with the DILTIAZEM 180 MG once daily.   2.) STOP the ASPIRIN. This will replaced with the APIXABAN (ELIQUIS).   3.) Keep track of your heart rate and blood pressure at home, ideally 1-2x daily  4.) CALL your primary to discuss your Er visit and to arrange follow-up with a Cardiologist. Dr. Darrold Junker is on call today.  Continue your lasix, cozaar, hytrin, and crestor as usual.

## 2020-12-19 NOTE — ED Triage Notes (Signed)
75 y/o female presenting via [facility] after she developed palpitations and was noted to have new onset Afib. She denies a h/o afib. No recent illness. She noticed this after being off on her routine with eating a later lunch. She also reports a few brief episodes last week but not like today's episode. Denies dyspnea.

## 2020-12-19 NOTE — ED Provider Notes (Signed)
[facility] Emergency Department Provider Note  ____________________________________________   Event Date/Time   First MD Initiated Contact with Patient 12/19/20 1514     (approximate)  I have reviewed the triage vital signs and the nursing notes.   HISTORY  Chief Complaint Palpitations    HPI Dorothy Potter is a 75 y.o. female  with h/o CVA, HTN, here with palpitations, weakness. Pt reportedly has had increasing issues with HTN, was just seen by her PCP and had her amlodipine increased. She reports that she presents today because over the past 24 hours, she's noticed palpitations, irregular heart beat and generalized weakness. Denies h/o AFib. She has had somewhat similar symptoms in the past with 2 brief episodes of palpitations in the last 2 weeks or so, but these resolved very quickly.  She currently feels back to her baseline.  She states she just felt like her heart was beating irregularly but not necessarily quickly.  No chest pressure.  No shortness of breath.  No nausea or diaphoresis.  No nausea or vomiting.  No recent illness.  She has been taking her medications as prescribed.  She does take atenolol and has been compliant with this.  She does note that she has had some weight loss recently which she attributed to diet changes.  No other recent changes in health.    Past Medical History:  Diagnosis Date   Anemia    Diverticulosis    History of colon polyps    Hypertension    Pure hypercholesterolemia    Stroke (HCC)     There are no problems to display for this patient.   Past Surgical History:  Procedure Laterality Date   ABDOMINAL HYSTERECTOMY     COLONOSCOPY     COLONOSCOPY WITH PROPOFOL N/A 12/18/2017   Procedure: COLONOSCOPY WITH PROPOFOL;  Surgeon: Toledo, Boykin Nearing, MD;  Location: [facility] ENDOSCOPY;  Service: Gastroenterology;  Laterality: N/A;    Prior to Admission medications   Medication Sig Start Date End Date Taking?  Authorizing Provider  apixaban (ELIQUIS) 5 MG TABS tablet Take 1 tablet (5 mg total) by mouth 2 (two) times daily. 12/19/20 01/18/21 Yes Shaune Pollack, MD  diltiazem (CARDIZEM CD) 180 MG 24 hr capsule Take 1 capsule (180 mg total) by mouth daily. 12/19/20 12/19/21 Yes Shaune Pollack, MD  atenolol (TENORMIN) 50 MG tablet Take 50 mg by mouth daily.    [provider]  calcium carbonate (OSCAL) 1500 (600 Ca) MG TABS tablet Take 600 mg by mouth 2 (two) times daily with a meal.    [provider]  cholecalciferol (VITAMIN D) 400 units TABS tablet Take 800 Units by mouth.    [provider]  ferrous sulfate 325 (65 FE) MG tablet Take 325 mg by mouth daily with breakfast.    [provider]  furosemide (LASIX) 20 MG tablet Take 20 mg by mouth.    [provider]  losartan (COZAAR) 100 MG tablet Take 100 mg by mouth daily.    [provider]  rosuvastatin (CRESTOR) 10 MG tablet Take 10 mg by mouth daily.    [provider]  terazosin (HYTRIN) 5 MG capsule Take 5 mg by mouth at bedtime.    [provider]    Allergies Ciprocin-fluocin-procin [fluocinolone], Flagyl [metronidazole], and Quinolones  Family History  Problem Relation Age of Onset   Breast cancer Neg Hx     Social History Social History   Tobacco Use   Smoking status: Never  Smokeless tobacco: Never  Vaping Use   Vaping Use: Never used  Substance Use Topics   Alcohol use: Never   Drug use: Never    Review of Systems  Review of Systems  Constitutional:  Positive for fatigue. Negative for fever.  HENT:  Negative for congestion and sore throat.   Eyes:  Negative for visual disturbance.  Respiratory:  Positive for shortness of breath. Negative for cough.   Cardiovascular:  Positive for palpitations. Negative for chest pain.  Gastrointestinal:  Negative for abdominal pain, diarrhea, nausea and vomiting.  Genitourinary:  Negative for flank pain.   Musculoskeletal:  Negative for back pain and neck pain.  Skin:  Negative for rash and wound.  Neurological:  Positive for weakness.  All other systems reviewed and are negative.   ____________________________________________  PHYSICAL EXAM:      VITAL SIGNS: ED Triage Vitals  Enc Vitals Group     BP      Pulse      Resp      Temp      Temp src      SpO2      Weight      Height      Head Circumference      Peak Flow      Pain Score      Pain Loc      Pain Edu?      Excl. in GC?      Physical Exam Vitals and nursing note reviewed.  Constitutional:      General: She is not in acute distress.    Appearance: She is well-developed.  HENT:     Head: Normocephalic and atraumatic.  Eyes:     Conjunctiva/sclera: Conjunctivae normal.  Cardiovascular:     Rate and Rhythm: Tachycardia present. Rhythm irregular.     Heart sounds: Normal heart sounds. No murmur heard.   No friction rub.  Pulmonary:     Effort: Pulmonary effort is normal. No respiratory distress.     Breath sounds: Normal breath sounds. No wheezing or rales.  Abdominal:     General: There is no distension.     Palpations: Abdomen is soft.     Tenderness: There is no abdominal tenderness.  Musculoskeletal:     Cervical back: Neck supple.  Skin:    General: Skin is warm.     Capillary Refill: Capillary refill takes less than 2 seconds.     Findings: No rash.  Neurological:     Mental Status: She is alert and oriented to person, place, and time.     Motor: No abnormal muscle tone.      ____________________________________________   LABS (all labs ordered are listed, but only abnormal results are displayed)  Labs Reviewed  COMPREHENSIVE METABOLIC PANEL - Abnormal; Notable for the following components:      Result Value   Glucose, Bld 113 (*)    All other components within normal limits  BRAIN NATRIURETIC PEPTIDE - Abnormal; Notable for the following components:   B Natriuretic Peptide 171.9 (*)     All other components within normal limits  CBC WITH DIFFERENTIAL/PLATELET  MAGNESIUM  TSH  TROPONIN I (HIGH SENSITIVITY)  TROPONIN I (HIGH SENSITIVITY)    ____________________________________________  EKG: Normal sinus rhythm, VR 86. PR 245, QRS 88, QTc 425. No acute st elevation. LAE.  ________________________________________  RADIOLOGY All imaging, including plain films, CT scans, and ultrasounds, independently reviewed by me, and interpretations confirmed via formal radiology reads.  ED  MD interpretation:   CXR: Pulmonary vascular congestion  Official radiology report(s): DG Chest Portable 1 View  Result Date: 12/19/2020 CLINICAL DATA:  Weakness EXAM: PORTABLE CHEST 1 VIEW COMPARISON:  2014 FINDINGS: Reverse lordotic. Pulmonary vascular congestion. Probable scarring at the lung bases. No significant pleural effusion. Cardiomediastinal contours are within normal limits for technique. Sigmoid curvature of the thoracic spine. IMPRESSION: Pulmonary vascular congestion. Electronically Signed   By: Guadlupe Spanish M.D.   On: 12/19/2020 15:43    ____________________________________________  PROCEDURES   Procedure(s) performed (including Critical Care):  Procedures  ____________________________________________  INITIAL IMPRESSION / MDM / ASSESSMENT AND PLAN / ED COURSE  As part of my medical decision making, I reviewed the following data within the electronic MEDICAL RECORD NUMBER Nursing notes reviewed and incorporated, Old chart reviewed, Notes from prior ED visits, and [place] Controlled Substance Database       *Dorothy Potter was evaluated in Emergency Department on 12/19/2020 for the symptoms described in the history of present illness. She was evaluated in the context of the global COVID-19 pandemic, which necessitated consideration that the patient might be at risk for infection with the SARS-CoV-2 virus that causes COVID-19. Institutional protocols and algorithms that pertain  to the evaluation of patients at risk for COVID-19 are in a state of rapid change based on information released by regulatory bodies including the CDC and federal and state organizations. These policies and algorithms were followed during the patient's care in the ED.  Some ED evaluations and interventions may be delayed as a result of limited staffing during the pandemic.*     Medical Decision Making:  75 yo F here with new onset atrial fibrillation, resolved spontaneously in the ED.  Patient has had intermittent palpitations at home so I suspect this could have been intermittent for at least the last several days.  She is overall very well-appearing and in no distress.  Heart rate is in the 70s to 80s and normal sinus rhythm.  Blood pressure stable.  Lab work reviewed and is overall very reassuring.  CBC without significant leukocytosis or anemia.  CMP unremarkable.  Magnesium normal.  Troponin negative.  TSH normal.  BNP just slightly elevated and she does not appear hypervolemic clinically.  Chest x-ray is clear.  Discussed the case with Dr. Darrold Junker.  Will start the patient on Eliquis as she has a CHA2DS2-VASc score of 6.  She will stop her aspirin.  Will transition her from amlodipine to of diltiazem as she is also maximized on atenolol.  Will discharge with outpatient cardiology follow-up and return precautions.  ____________________________________________  FINAL CLINICAL IMPRESSION(S) / ED DIAGNOSES  Final diagnoses:  New onset atrial fibrillation (HCC)     MEDICATIONS GIVEN DURING THIS VISIT:  Medications  metoprolol tartrate (LOPRESSOR) tablet 25 mg (25 mg Oral Given 12/19/20 1607)     ED Discharge Orders          Ordered    diltiazem (CARDIZEM CD) 180 MG 24 hr capsule  Daily        12/19/20 1650    apixaban (ELIQUIS) 5 MG TABS tablet  2 times daily        12/19/20 1650             Note:  This document was prepared using Dragon voice recognition software and may  include unintentional dictation errors.   Shaune Pollack, MD 12/19/20 [phone]

## 2020-12-26 ENCOUNTER — Emergency Department
Admission: EM | Admit: 2020-12-26 | Discharge: 2020-12-26 | Disposition: A | Discharge status: Home / Self Care | Payer: Medicare Other | Attending: Emergency Medicine | Admitting: Emergency Medicine

## 2020-12-26 ENCOUNTER — Other Ambulatory Visit: Payer: Self-pay

## 2020-12-26 ENCOUNTER — Encounter: Payer: Self-pay | Admitting: Emergency Medicine

## 2020-12-26 DIAGNOSIS — D649 Anemia, unspecified: Secondary | ICD-10-CM | POA: Diagnosis not present

## 2020-12-26 DIAGNOSIS — Z5181 Encounter for therapeutic drug level monitoring: Secondary | ICD-10-CM | POA: Insufficient documentation

## 2020-12-26 DIAGNOSIS — Z87898 Personal history of other specified conditions: Secondary | ICD-10-CM | POA: Diagnosis not present

## 2020-12-26 DIAGNOSIS — I10 Essential (primary) hypertension: Secondary | ICD-10-CM | POA: Diagnosis not present

## 2020-12-26 DIAGNOSIS — T45515A Adverse effect of anticoagulants, initial encounter: Secondary | ICD-10-CM

## 2020-12-26 DIAGNOSIS — Z79899 Other long term (current) drug therapy: Secondary | ICD-10-CM | POA: Diagnosis not present

## 2020-12-26 LAB — PROTIME-INR
INR: 1.2 (ref 0.8–1.2)
Prothrombin Time: 15.6 seconds — ABNORMAL HIGH (ref 11.4–15.2)

## 2020-12-26 NOTE — ED Triage Notes (Signed)
Pt daughter reports she was around a lot of dust yesterday before her nose bleed and in the past that would cause a nose bleed but because she is taking eloquis now she needs to have her blood checked.

## 2020-12-26 NOTE — ED Provider Notes (Signed)
[facility] Emergency Department Provider Note   ____________________________________________   Event Date/Time   First MD Initiated Contact with Patient 12/26/20 1122     (approximate)  I have reviewed the triage vital signs and the nursing notes.   HISTORY  Chief Complaint Blood test    HPI Dorothy Potter is a 75 y.o. female who presents after an episode of epistaxis stating that "I need my blood levels checked to make sure they are not too thin".  Patient states that she has had a history of epistaxis in the past especially when she is in her garden and had previously related to allergies however she did blow her nose yesterday and had a small amount of blood on the tissue.  Patient denies any active bleeding at this time or since this event.  Patient states that she was started on Eliquis approximately 1 week prior to arrival due to paroxysmal atrial fibrillation.  Patient denies any other abnormal bleeding/bruising          Past Medical History:  Diagnosis Date   Anemia    Diverticulosis    History of colon polyps    Hypertension    Pure hypercholesterolemia    Stroke (HCC)     There are no problems to display for this patient.   Past Surgical History:  Procedure Laterality Date   ABDOMINAL HYSTERECTOMY     COLONOSCOPY     COLONOSCOPY WITH PROPOFOL N/A 12/18/2017   Procedure: COLONOSCOPY WITH PROPOFOL;  Surgeon: Toledo, Boykin Nearing, MD;  Location: [facility] ENDOSCOPY;  Service: Gastroenterology;  Laterality: N/A;    Prior to Admission medications   Medication Sig Start Date End Date Taking? Authorizing Provider  apixaban (ELIQUIS) 5 MG TABS tablet Take 1 tablet (5 mg total) by mouth 2 (two) times daily. 12/19/20 01/18/21  Shaune Pollack, MD  atenolol (TENORMIN) 50 MG tablet Take 50 mg by mouth daily.    [provider]  calcium carbonate (OSCAL) 1500 (600 Ca) MG TABS tablet Take 600 mg by mouth 2 (two) times daily with a meal.     [provider]  cholecalciferol (VITAMIN D) 400 units TABS tablet Take 800 Units by mouth.    [provider]  diltiazem (CARDIZEM CD) 180 MG 24 hr capsule Take 1 capsule (180 mg total) by mouth daily. 12/19/20 12/19/21  Shaune Pollack, MD  ferrous sulfate 325 (65 FE) MG tablet Take 325 mg by mouth daily with breakfast.    [provider]  furosemide (LASIX) 20 MG tablet Take 20 mg by mouth.    [provider]  losartan (COZAAR) 100 MG tablet Take 100 mg by mouth daily.    [provider]  rosuvastatin (CRESTOR) 10 MG tablet Take 10 mg by mouth daily.    [provider]  terazosin (HYTRIN) 5 MG capsule Take 5 mg by mouth at bedtime.    [provider]    Allergies Ciprocin-fluocin-procin [fluocinolone], Flagyl [metronidazole], and Quinolones  Family History  Problem Relation Age of Onset   Breast cancer Neg Hx     Social History Social History   Tobacco Use   Smoking status: Never   Smokeless tobacco: Never  Vaping Use   Vaping Use: Never used  Substance Use Topics   Alcohol use: Never   Drug use: Never    Review of Systems Constitutional: No fever/chills Eyes: No visual changes. ENT: No sore throat.  Endorses previous epistaxis Cardiovascular: Denies chest pain. Respiratory: Denies shortness  of breath. Gastrointestinal: No abdominal pain.  No nausea, no vomiting.  No diarrhea. Genitourinary: Negative for dysuria. Musculoskeletal: Negative for acute arthralgias Skin: Negative for rash. Neurological: Negative for headaches, weakness/numbness/paresthesias in any extremity Psychiatric: Negative for suicidal ideation/homicidal ideation   ____________________________________________   PHYSICAL EXAM:  VITAL SIGNS: ED Triage Vitals  Enc Vitals Group     BP 12/26/20 1028 (!) 164/64     Pulse Rate 12/26/20 1028 65     Resp 12/26/20 1028 20     Temp 12/26/20 1028 98.1 F (36.7 C)     Temp Source 12/26/20  1028 Oral     SpO2 12/26/20 1028 98 %     Weight 12/26/20 1026 178 lb 9.2 oz (81 kg)     Height 12/26/20 1026 5\' 8"  (1.727 m)     Head Circumference --      Peak Flow --      Pain Score 12/26/20 1026 0     Pain Loc --      Pain Edu? --      Excl. in GC? --    Constitutional: Alert and oriented. Well appearing and in no acute distress. Eyes: Conjunctivae are normal. PERRL. Head: Atraumatic. Nose: No congestion/rhinnorhea.  Mild irritation appreciated in bilateral nares Mouth/Throat: Mucous membranes are moist. Neck: No stridor Cardiovascular: Grossly normal heart sounds.  Good peripheral circulation. Respiratory: Normal respiratory effort.  No retractions. Gastrointestinal: Soft and nontender. No distention. Musculoskeletal: No obvious deformities Neurologic:  Normal speech and language. No gross focal neurologic deficits are appreciated. Skin:  Skin is warm and dry. No rash noted. Psychiatric: Mood and affect are normal. Speech and behavior are normal.  ____________________________________________   LABS (all labs ordered are listed, but only abnormal results are displayed)  Labs Reviewed  PROTIME-INR - Abnormal; Notable for the following components:      Result Value   Prothrombin Time 15.6 (*)    All other components within normal limits   ____________________________________________  PROCEDURES  Procedure(s) performed (including Critical Care):  Procedures   ____________________________________________   INITIAL IMPRESSION / ASSESSMENT AND PLAN / ED COURSE  As part of my medical decision making, I reviewed the following data within the electronic medical record, if available:  Nursing notes reviewed and incorporated, Labs reviewed, EKG interpreted, Old chart reviewed, Radiograph reviewed and Notes from prior ED visits reviewed and incorporated      Patient has no active bleeding seen on physical exam however there is significant amount of irritation in  bilateral nares concerning for possible allergic origin of patient's epistaxis.  Patient counseled on using Vaseline barrier protection as well as Afrin if this epistaxis occurs in the future.  Patient encouraged to continue her Eliquis and follow-up with her primary care physician within the next 1-3 days for improvement in patient's nasal irritation.  Dispo: Discharge home with PCP follow-up      ____________________________________________   FINAL CLINICAL IMPRESSION(S) / ED DIAGNOSES  Final diagnoses:  History of epistaxis  Anticoagulant causing adverse effect in therapeutic use, initial encounter     ED Discharge Orders     None        Note:  This document was prepared using Dragon voice recognition software and may include unintentional dictation errors.    12/28/20, MD 12/26/20 [phone]

## 2020-12-26 NOTE — ED Triage Notes (Signed)
Pt reports is here to have her labs checked to see how thin her blood is because she started on Eloquis 5 days ago and she had a nose bleed.

## 2020-12-26 NOTE — Discharge Instructions (Signed)
Please use a small amount of vaseline (eucerin ointment) on the interior of each nostril before going into an environment that is high in allergens as well as 30 minutes before going to sleep each night. If a nosebleed happens again, please use afrin nasal spray. Blow your nose to remove any excess blood or clots, use 1 spray in each nostril, pinch over your nostrils, lean forward, and continuing holding pressure for 15 minutes without letting go. You may need to do this routine one more time and if the nosebleed has not resolved, please return to the emergency department for further evaluation and management.

## 2021-07-22 ENCOUNTER — Other Ambulatory Visit: Payer: Self-pay | Admitting: Physician Assistant

## 2021-07-22 DIAGNOSIS — Z1231 Encounter for screening mammogram for malignant neoplasm of breast: Secondary | ICD-10-CM

## 2021-10-18 ENCOUNTER — Ambulatory Visit
Admission: RE | Admit: 2021-10-18 | Discharge: 2021-10-18 | Disposition: A | Discharge status: Home / Self Care | Payer: Medicare Other | Source: Ambulatory Visit | Attending: Physician Assistant | Admitting: Physician Assistant

## 2021-10-18 DIAGNOSIS — Z1231 Encounter for screening mammogram for malignant neoplasm of breast: Secondary | ICD-10-CM | POA: Diagnosis present

## 2021-11-23 ENCOUNTER — Other Ambulatory Visit: Payer: Self-pay

## 2021-11-23 ENCOUNTER — Encounter: Payer: Self-pay | Admitting: Emergency Medicine

## 2021-11-23 ENCOUNTER — Emergency Department
Admission: EM | Admit: 2021-11-23 | Discharge: 2021-11-23 | Disposition: A | Discharge status: Home / Self Care | Payer: Medicare Other | Attending: Emergency Medicine | Admitting: Emergency Medicine

## 2021-11-23 ENCOUNTER — Emergency Department: Payer: Medicare Other

## 2021-11-23 DIAGNOSIS — I10 Essential (primary) hypertension: Secondary | ICD-10-CM | POA: Diagnosis not present

## 2021-11-23 DIAGNOSIS — W010XXA Fall on same level from slipping, tripping and stumbling without subsequent striking against object, initial encounter: Secondary | ICD-10-CM | POA: Diagnosis not present

## 2021-11-23 DIAGNOSIS — R519 Headache, unspecified: Secondary | ICD-10-CM | POA: Insufficient documentation

## 2021-11-23 DIAGNOSIS — W19XXXA Unspecified fall, initial encounter: Secondary | ICD-10-CM

## 2021-11-23 NOTE — ED Triage Notes (Signed)
Pt via POV from home. Pt went to step off the porch. Pt states that she did not hit her head. Pt has not complaints. States she missed a step and fell onto her bottom. Denies head injury, Denies LOC. Pt is on thinners. Pt has not pain. 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 Pt is A&OX4 and NAD.

## 2021-11-23 NOTE — ED Provider Notes (Signed)
[facility] Provider Note    Event Date/Time   First MD Initiated Contact with Patient 11/23/21 1638     (approximate)   History   Fall   HPI  Dorothy Potter is a 76 y.o. female with history of hypertension, stroke, reticulosis presents emergency department after a fall.  Patient states she was standing on the porch and stepped back incorrectly and landed on a mat covering concrete.  Patient landed on her bottom.  No LOC.  States she knows that since she is on Eliquis she needed to come to the ED.      Physical Exam   Triage Vital Signs: ED Triage Vitals  Enc Vitals Group     BP 11/23/21 1622 (!) 189/74     Pulse Rate 11/23/21 1622 75     Resp 11/23/21 1622 18     Temp 11/23/21 1622 98 F (36.7 C)     Temp src --      SpO2 11/23/21 1622 98 %     Weight 11/23/21 1619 166 lb (75.3 kg)     Height 11/23/21 1619 5\' 8"  (1.727 m)     Head Circumference --      Peak Flow --      Pain Score 11/23/21 1619 0     Pain Loc --      Pain Edu? --      Excl. in [place]? --     Most recent vital signs: Vitals:   11/23/21 1622  BP: (!) 189/74  Pulse: 75  Resp: 18  Temp: 98 F (36.7 C)  SpO2: 98%     General: Awake, no distress.   CV:  Good peripheral perfusion. regular rate and  rhythm Resp:  Normal effort.  Abd:  No distention.   Other:  Patient is able to ambulate without difficulty, spine is nontender, hips are nontender   ED Results / Procedures / Treatments   Labs (all labs ordered are listed, but only abnormal results are displayed) Labs Reviewed - No data to display   EKG     RADIOLOGY CT of the head    PROCEDURES:   Procedures   MEDICATIONS ORDERED IN ED: Medications - No data to display   IMPRESSION / MDM / [place] / ED COURSE  I reviewed the triage vital signs and the nursing notes.                              Differential diagnosis includes, but is not limited to, subdural, subarachnoid, contusion,  fall  Patient's presentation is most consistent with acute presentation with potential threat to life or bodily function.   Due to the patient being on Eliquis along with a fall at her age we will do a CT of the head to ensure there is no bleeding  CT of the head without contrast ordered   CT of the head without contrast independently reviewed and interpreted by me as being negative for any acute abnormality.  Confirmed by radiology  I did explain the findings to the patient.  She is to follow-up with her regular doctor.  Take Tylenol for pain as needed.  Return emergency department worsening.  She is in agreement treatment plan.  Discharged stable condition.   FINAL CLINICAL IMPRESSION(S) / ED DIAGNOSES   Final diagnoses:  Fall, initial encounter     Rx / DC Orders   ED Discharge Orders  None        Note:  This document was prepared using Dragon voice recognition software and may include unintentional dictation errors.    Faythe Ghee, PA-C 11/23/21 1733    Merwyn Katos, MD 11/23/21 2009

## 2021-11-23 NOTE — ED Notes (Signed)
Pt A&Ox4 ambulatory at d/c with independent steady gait. Pt verbalized understanding of d/c instructions and follow up care. 

## 2021-11-23 NOTE — ED Notes (Signed)
Provider made aware of pt's current BP. Pt states she takes BP medication around 6 pm daily and has white coat syndrome. Provider states to recheck after lying pt down.

## 2021-11-23 NOTE — Discharge Instructions (Signed)
Follow-up with your regular doctor as needed.  If you have pain you may take Tylenol extra strength as directed on the bottle.  Apply ice to any areas that hurt.  Return emergency department worsening.  Your CT of the head did not show any bleeding of the brain.

## 2021-12-03 IMAGING — DX DG CHEST 1V PORT
1 series · 1 of 1 positions shown · non-contrast
Comparison: 1381

CLINICAL DATA: Weakness

EXAM:
PORTABLE CHEST 1 VIEW

[chest ap]
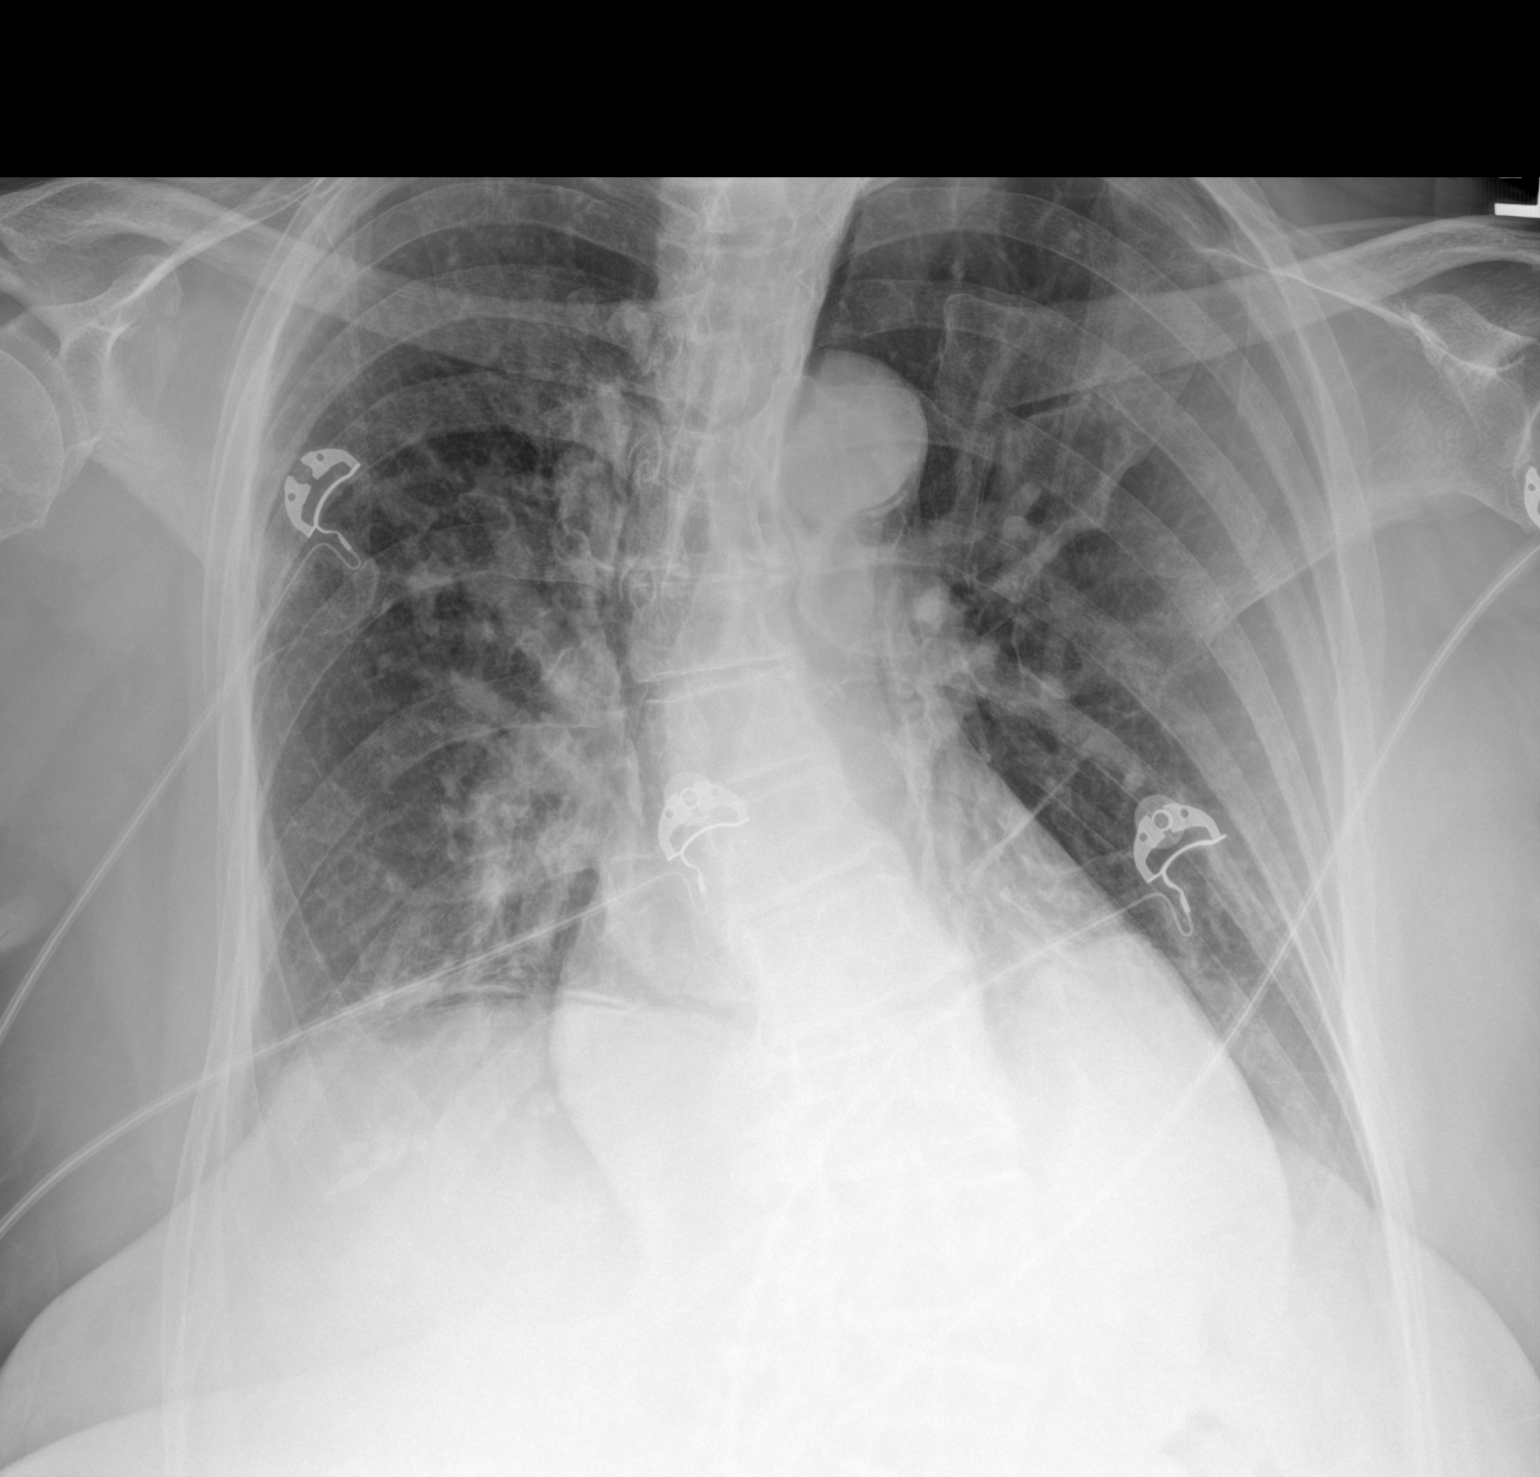

[1 of 1 positions shown; findings below may reference images not displayed]

FINDINGS: Reverse lordotic. Pulmonary vascular congestion. Probable scarring
at the lung bases. No significant pleural effusion.
Cardiomediastinal contours are within normal limits for technique.
Sigmoid curvature of the thoracic spine.
IMPRESSION: Pulmonary vascular congestion.

## 2022-02-21 ENCOUNTER — Ambulatory Visit (LOCAL_COMMUNITY_HEALTH_CENTER): Payer: Medicare Other

## 2022-02-21 DIAGNOSIS — Z23 Encounter for immunization: Secondary | ICD-10-CM

## 2022-02-21 DIAGNOSIS — Z7185 Encounter for immunization safety counseling: Secondary | ICD-10-CM

## 2022-02-21 NOTE — Progress Notes (Signed)
  Are you feeling sick today? No   Have you ever received a dose of COVID-19 Vaccine? (Pfizer, Janssen, Moderna, Novavax, Other) Yes  If yes, which vaccine and how many doses?   Pfizer and 5 doses    Did you bring the vaccination record card or other documentation?  No   Do you have a health condition or are undergoing treatment that makes you moderately or severely immunocompromised? This would include, but not be limited to: cancer, HIV, organ transplant, immunosuppressive therapy/high-dose corticosteroids, or moderate/severe primary immunodeficiency.  No  Have you received COVID-19 vaccine before or during hematopoietic cell transplant (HCT) or CAR-T-cell therapies? No  Have you ever had an allergic reaction to: (This would include a severe allergic reaction or a reaction that caused hives, swelling, or respiratory distress, including wheezing.) A component of a COVID-19 vaccine or a previous dose of COVID-19 vaccine? No   Have you ever had an allergic reaction to another vaccine (other thanCOVID-19 vaccine) or an injectable medication? (This would include a severe allergic reaction or a reaction that caused hives, swelling, or respiratory distress, including wheezing.)   No    Do you have a history of any of the following:  Myocarditis or Pericarditis No  Dermal fillers:  No  Multisystem Inflammatory Syndrome (MIS-C or MIS-A)? No  COVID-19 disease within the past 3 months? No  Vaccinated with monkeypox vaccine in the last 4 weeks? No  Pfizer Comirnaty 2023-24 (12 yrs+) given and tolerated well. Stayed for 15 min observation without problem. Updated [facility] copy given. Tejal Monroy, RN      

## 2022-07-26 ENCOUNTER — Other Ambulatory Visit: Payer: Self-pay | Admitting: Physician Assistant

## 2022-07-26 DIAGNOSIS — Z1231 Encounter for screening mammogram for malignant neoplasm of breast: Secondary | ICD-10-CM

## 2022-10-20 ENCOUNTER — Ambulatory Visit
Admission: RE | Admit: 2022-10-20 | Discharge: 2022-10-20 | Disposition: A | Discharge status: Home / Self Care | Payer: Medicare Other | Source: Ambulatory Visit | Attending: Physician Assistant | Admitting: Physician Assistant

## 2022-10-20 DIAGNOSIS — Z1231 Encounter for screening mammogram for malignant neoplasm of breast: Secondary | ICD-10-CM | POA: Insufficient documentation

## 2023-01-01 ENCOUNTER — Encounter: Payer: Self-pay | Admitting: Internal Medicine

## 2023-01-10 ENCOUNTER — Ambulatory Visit: Admission: RE | Admit: 2023-01-10 | Payer: Medicare Other | Source: Home / Self Care | Admitting: Internal Medicine

## 2023-01-10 ENCOUNTER — Encounter: Admission: RE | Payer: Self-pay | Source: Home / Self Care

## 2023-01-10 HISTORY — DX: Prediabetes: R73.03

## 2023-01-10 HISTORY — DX: Transient cerebral ischemic attack, unspecified: G45.9

## 2023-01-10 HISTORY — DX: Other cerebral infarction due to occlusion or stenosis of small artery: I63.81

## 2023-01-10 HISTORY — DX: Meningitis, unspecified: G03.9

## 2023-01-10 HISTORY — DX: Unspecified atrial fibrillation: I48.91

## 2023-01-10 HISTORY — DX: Cardiac arrhythmia, unspecified: I49.9

## 2023-01-10 SURGERY — COLONOSCOPY WITH PROPOFOL
Anesthesia: General

## 2023-09-19 ENCOUNTER — Other Ambulatory Visit: Payer: Self-pay | Admitting: Physician Assistant

## 2023-09-19 DIAGNOSIS — Z1231 Encounter for screening mammogram for malignant neoplasm of breast: Secondary | ICD-10-CM

## 2023-10-24 ENCOUNTER — Ambulatory Visit
Admission: RE | Admit: 2023-10-24 | Discharge: 2023-10-24 | Disposition: A | Discharge status: Home / Self Care | Source: Ambulatory Visit | Attending: Physician Assistant | Admitting: Physician Assistant

## 2023-10-24 DIAGNOSIS — Z1231 Encounter for screening mammogram for malignant neoplasm of breast: Secondary | ICD-10-CM | POA: Insufficient documentation
